# Patient Record
Sex: Female | Born: 2000
Health system: Southern US, Community
[De-identification: ages and names within clinical notes are randomized; demographics above are authoritative.]

## PROBLEM LIST (undated history)

## (undated) DIAGNOSIS — R569 Unspecified convulsions: Secondary | ICD-10-CM

## (undated) DIAGNOSIS — L02419 Cutaneous abscess of limb, unspecified: Secondary | ICD-10-CM

---
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
## 2018-01-01 NOTE — Telephone Encounter (Signed)
I have been out of the office since the afternoon of April 17th. I called patient today and left a message asking her to return the call. TG

## 2018-01-01 NOTE — Telephone Encounter (Signed)
Annette Poole called back and said that she had missed a quiz due to having a seizure. Her professor wants a note saying that she has epilepsy in order to excuse her and allow her to make up the quiz. She wants the note sent to [email or web address]. I wrote the letter and will email it to her as requested. TG

## 2018-10-15 ENCOUNTER — Other Ambulatory Visit (INDEPENDENT_AMBULATORY_CARE_PROVIDER_SITE_OTHER): Payer: Self-pay | Admitting: Family

## 2018-10-15 DIAGNOSIS — G40409 Other generalized epilepsy and epileptic syndromes, not intractable, without status epilepticus: Secondary | ICD-10-CM

## 2019-07-22 DIAGNOSIS — F418 Other specified anxiety disorders: Secondary | ICD-10-CM | POA: Insufficient documentation

## 2019-11-22 ENCOUNTER — Other Ambulatory Visit (INDEPENDENT_AMBULATORY_CARE_PROVIDER_SITE_OTHER): Payer: Self-pay | Admitting: Family

## 2019-11-22 DIAGNOSIS — G40409 Other generalized epilepsy and epileptic syndromes, not intractable, without status epilepticus: Secondary | ICD-10-CM

## 2019-11-24 NOTE — Telephone Encounter (Signed)
Patient has not been seen since 2019

## 2019-12-04 ENCOUNTER — Telehealth (INDEPENDENT_AMBULATORY_CARE_PROVIDER_SITE_OTHER): Payer: 59 | Admitting: Family

## 2019-12-08 ENCOUNTER — Other Ambulatory Visit (INDEPENDENT_AMBULATORY_CARE_PROVIDER_SITE_OTHER): Payer: Self-pay | Admitting: Family

## 2019-12-08 DIAGNOSIS — G40409 Other generalized epilepsy and epileptic syndromes, not intractable, without status epilepticus: Secondary | ICD-10-CM

## 2019-12-16 ENCOUNTER — Other Ambulatory Visit: Payer: Self-pay

## 2019-12-16 ENCOUNTER — Encounter (INDEPENDENT_AMBULATORY_CARE_PROVIDER_SITE_OTHER): Payer: Self-pay | Admitting: Family

## 2019-12-16 ENCOUNTER — Telehealth (INDEPENDENT_AMBULATORY_CARE_PROVIDER_SITE_OTHER): Payer: 59 | Admitting: Family

## 2019-12-16 DIAGNOSIS — G40409 Other generalized epilepsy and epileptic syndromes, not intractable, without status epilepticus: Secondary | ICD-10-CM | POA: Diagnosis not present

## 2019-12-16 DIAGNOSIS — G253 Myoclonus: Secondary | ICD-10-CM | POA: Diagnosis not present

## 2019-12-16 DIAGNOSIS — F418 Other specified anxiety disorders: Secondary | ICD-10-CM

## 2019-12-16 MED ORDER — LEVETIRACETAM 500 MG PO TABS: ORAL_TABLET | ORAL | 5 refills | Status: AC

## 2019-12-16 NOTE — Progress Notes (Signed)
This is a Pediatric Specialist E-Visit follow up consult provided via [place] consented to an E-Visit consult today.  Location of patient: Annette Poole is at home Location of provider: Molli Posey is in office Patient was referred by Delilah Shan, MD   The following participants were involved in this E-Visit: patient, CMA, provider  Chief Complain/ Reason for E-Visit today: Epilepsy Total time on call: 15 min Follow up: 1 year     Annette Poole   MRN:  [id number]  2001/03/30   Provider: Rockwell Germany NP-C Location of Care: [facility]  Visit type: Routine visit  Last visit: 10/01/2017  Referral source: Scarlette Ar, MD History from: patient and chcn chart  Brief history:  History of juvenile myoclonic epilepsy and obesity. She is taking and tolerating Levetiracetam for her seizure disorder. She has Clonazepam for myoclonic jerks but does not use it.  Today's concerns:  Annette Poole reports today that she has had no seizures since her last visit in 2019. She believes that she has had about 15 episodes of myoclonic jerks but says that they were brief and did not require intervention. She says that she has been complaint with taking Levetiracetam and usually gets 8 hours or more of sleep each night.   Annette Poole is in college and reports that she is doing well academically. She is interested in psychology as a major and is struggling with a statistics class at this time. She also reports that she started Lexapro about 2 months ago for depression and anxiety, and says that she feels much better. She plans to get established with a therapist but hasn't had time because of school.   Annette Poole has been otherwise generally healthy and has no other health concerns today other than previously mentioned.   Review of systems: Please see HPI for neurologic and other pertinent review of systems. Otherwise all other systems were reviewed and were  negative.  Problem List: Patient Active Problem List   Diagnosis Date Noted  . 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 Epilepsy, myoclonus ([place]) 12/28/2014  . Morbid obesity ([place]) 12/28/2014  . Acanthosis nigricans, acquired 12/28/2014     Past Medical History:  Diagnosis Date  . Abscess of axilla   . Seizures ([place]) 03/2015    Past medical history comments: See HPI Copied from previous record: Her brother also has juvenile myoclonic epilepsy characterized by myoclonus initially with generalized tonic-clonic seizures. He has an EEG that is compatible with the condition. Interestingly, he stopped taking antiepileptic medication on his own and has not had recurrent seizures. His last known seizure was in 2012. At the time of her referral to this office in April 2016, Annette Poole had two years of episodes of "tremors," which are actually of myoclonic jerks. If she is sitting writing and has a jerk, she loses control of the pen that she has in her hand. The episodes seemed more prominent in the afternoon than they do in the morning. There have been times when she has lost her balance when she was standing and fell either forward or backwards. This appears to be a familial disorder. Mother took Depakote in addition to her brother who was on Depakote. Annette Poole's EEG on December 25, 2014 revealed 10 episodes of generalized polyspike and slow wave activity that were somewhat irregularly contoured. The episodes were brief and did not coincide with any clinical behavior  Surgical history: Past Surgical History:  Procedure Laterality Date  . KIDNEY STONE SURGERY  12/06/11  Annette Poole    Family history: family history includes Congestive Heart Failure in her maternal grandfather and paternal grandfather; Seizures in her brother and mother.   Social history: Social History   Socioeconomic History  . Marital status: Legally Separated    Spouse name: Not on file  . Number of children:  Not on file  . Years of education: Not on file  . Highest education level: Not on file  Occupational History  . Not on file  Tobacco Use  . Smoking status: Never Smoker  . Smokeless tobacco: Never Used  Substance and Sexual Activity  . Alcohol use: No    Alcohol/week: 0.0 standard drinks  . Drug use: No  . Sexual activity: Never  Other Topics Concern  . 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   She graduated from [employer].   She is a Holiday representative at [employer].   She lives [place] campus.   She enjoys theater, music, and swimming.    Social Determinants of Health   Financial Resource Strain:   . Difficulty of Paying Living Expenses:   Food Insecurity:   . Worried About Programme researcher, broadcasting/film/video in the Last Year:   . Barista in the Last Year:   Transportation Needs:   . Freight forwarder (Medical):   Marland Kitchen Lack of Transportation (Non-Medical):   Physical Activity:   . Days of Exercise per Week:   . Minutes of Exercise per Session:   Stress:   . Feeling of Stress :   Social Connections:   . Frequency of Communication with Friends and Family:   . Frequency of Social Gatherings with Friends and Family:   . Attends Religious Services:   . Active Member of Clubs or Organizations:   . Attends Banker Meetings:   Marland Kitchen Marital Status:   Intimate Partner Violence:   . Fear of Current or Ex-Partner:   . Emotionally Abused:   Marland Kitchen Physically Abused:   . Sexually Abused:    Past/failed meds:  Allergies: Allergies  Allergen Reactions  . Banana      Immunizations:  There is no immunization history on file for this patient.   Diagnostics/Screenings: Copied from previous record: 12/25/2014 -This is a abnormal record with the patient awake, drowsy and asleep.  The presence of high-voltage polyspike and slow-wave activity is epileptogenic from an electrographic viewpoint and would correlate with the condition of juvenile  myoclonic epilepsy. Annette Carwin, MD    Physical Exam: There were no vitals taken for this visit.  Examination was limited due to technical quality of video visit General: Well developed, well nourished obese young woman, seated at her home, in no evident distress, black hair, brown eyes, right handed Head: Head normocephalic and atraumatic. Neck: Supple Musculoskeletal: No obvious deformities or scoliosis Skin: No rashes or neurocutaneous lesions  Neurologic Exam Mental Status: Awake and fully alert.  Oriented to place and time.  Recent and remote memory intact.  Attention span, concentration, and fund of knowledge appropriate.  Mood and affect appropriate. Cranial Nerves: Extraocular movements full without nystagmus. Hearing intact and symmetric to voice.  Facial sensation intact.  Face and tongue move normally and symmetrically.  Motor: Normal functional bulk, tone and strength Sensory: Intact to touch and temperature in all extremities.  Coordination: Finger-to-nose performed accurately bilaterally.  Gait and Station: Arises from chair without difficulty.  Stance is normal. Gait demonstrates normal  stride length and balance.   Impression: 1. Juvenile myoclonic epilepsy 2. Myoclonic jerks 3. Morbid obesity 4. Anxiety and depression  Recommendations for plan of care: The patient's previous [facility] records were reviewed. Annette Poole has neither had nor required imaging or lab studies since the last visit. She is a 19 year old woman with history of juvenile myoclonic epilepsy, myoclonic jerks, morbid obesity, anxiety and depression. She is taking and tolerating Levetiracetam and has remained seizure free since her last visit. She has occasional brief myoclonic jerks which are not problematic to her. She does not take Clonazepam when these jerks occur so I will discontinue that medication. Annette Poole is taking Lexapro (prescribed by another provider) and plans to get established with a  therapist. I reminded her of the need for her to be compliant with medication and to get at least 8 hours of sleep each night. I encouraged her to get established with a therapist for her mood disorder. I will see her back in follow up in 1 year or sooner if needed. Annette Poole agreed with the plans made today.   The medication list was reviewed and reconciled. No changes were made in the prescribed medications today. A complete medication list was provided to the patient.  Allergies as of 12/16/2019      Reactions   Banana       Medication List       Accurate as of December 16, 2019 11:59 PM. If you have any questions, ask your nurse or doctor.        STOP taking these medications   clonazePAM 0.25 MG disintegrating tablet Commonly known as: KLONOPIN Stopped by: Annette Rising, NP   meloxicam 15 MG tablet Commonly known as: MOBIC Stopped by: Annette Rising, NP   Tri-Previfem 0.18/0.215/0.25 MG-35 MCG tablet Generic drug: Norgestimate-Ethinyl Estradiol Triphasic Stopped by: Annette Rising, NP     TAKE these medications   escitalopram 20 MG tablet Commonly known as: LEXAPRO Take 20 mg by mouth daily.   etonogestrel 68 MG Impl implant Commonly known as: NEXPLANON 1 each by Subdermal route once.   levETIRAcetam 500 MG tablet Commonly known as: KEPPRA TAKE 1 AND 1/2 TABLETS TWICE PER DAY What changed: See the new instructions. Changed by: Annette Rising, NP       Total time spent on the video with the patient was 15 minutes, of which 50% or more was spent in counseling and coordination of care.  Annette Rising NP-C [facility] Ph. [phone] Fax [phone]

## 2019-12-20 ENCOUNTER — Encounter (INDEPENDENT_AMBULATORY_CARE_PROVIDER_SITE_OTHER): Payer: Self-pay | Admitting: Family

## 2019-12-20 NOTE — Patient Instructions (Signed)
Thank you for meeting with me by video today.   Instructions for you until your next appointment are as follows: 1. Continue taking the Levetiracetam as prescribed. Try not to miss any doses 2. Remember that it is important for you to get at least 8 hours of sleep each night, as sleep deprivation can trigger seizures 3. Work on getting established with a therapist to help with anxiety and depression as we discussed today.  4. Please sign up for MyChart if you have not done so 5. Please plan to return for follow up in one year or sooner if needed.

## 2020-07-09 ENCOUNTER — Other Ambulatory Visit (HOSPITAL_BASED_OUTPATIENT_CLINIC_OR_DEPARTMENT_OTHER): Payer: Self-pay | Admitting: Emergency Medicine

## 2020-07-09 ENCOUNTER — Encounter (HOSPITAL_BASED_OUTPATIENT_CLINIC_OR_DEPARTMENT_OTHER): Payer: Self-pay | Admitting: *Deleted

## 2020-07-09 ENCOUNTER — Emergency Department (HOSPITAL_BASED_OUTPATIENT_CLINIC_OR_DEPARTMENT_OTHER): Payer: BC Managed Care – PPO

## 2020-07-09 ENCOUNTER — Other Ambulatory Visit: Payer: Self-pay

## 2020-07-09 ENCOUNTER — Emergency Department (HOSPITAL_BASED_OUTPATIENT_CLINIC_OR_DEPARTMENT_OTHER)
Admission: EM | Admit: 2020-07-09 | Discharge: 2020-07-09 | Disposition: A | Discharge status: Home / Self Care | Payer: BC Managed Care – PPO | Attending: Emergency Medicine | Admitting: Emergency Medicine

## 2020-07-09 DIAGNOSIS — J90 Pleural effusion, not elsewhere classified: Secondary | ICD-10-CM | POA: Insufficient documentation

## 2020-07-09 DIAGNOSIS — R079 Chest pain, unspecified: Secondary | ICD-10-CM | POA: Diagnosis present

## 2020-07-09 DIAGNOSIS — Z79899 Other long term (current) drug therapy: Secondary | ICD-10-CM | POA: Diagnosis not present

## 2020-07-09 DIAGNOSIS — J189 Pneumonia, unspecified organism: Secondary | ICD-10-CM

## 2020-07-09 DIAGNOSIS — R0602 Shortness of breath: Secondary | ICD-10-CM | POA: Diagnosis not present

## 2020-07-09 DIAGNOSIS — Z20822 Contact with and (suspected) exposure to covid-19: Secondary | ICD-10-CM | POA: Insufficient documentation

## 2020-07-09 DIAGNOSIS — M25512 Pain in left shoulder: Secondary | ICD-10-CM | POA: Diagnosis not present

## 2020-07-09 LAB — RESPIRATORY PANEL BY RT PCR (FLU A&B, COVID)
Influenza A by PCR: NEGATIVE
Influenza B by PCR: NEGATIVE
SARS Coronavirus 2 by RT PCR: NEGATIVE

## 2020-07-09 LAB — PREGNANCY, URINE: Preg Test, Ur: NEGATIVE

## 2020-07-09 MED ORDER — AZITHROMYCIN 250 MG PO TABS: 250.0000 mg | ORAL_TABLET | Freq: Every day | ORAL | 0 refills | Status: DC

## 2020-07-09 MED ORDER — IOHEXOL 350 MG/ML SOLN
100.0000 mL | Freq: Once | INTRAVENOUS | Status: AC | PRN
  Administered 2020-07-09: 100 mL via INTRAVENOUS

## 2020-07-09 MED ORDER — NAPROXEN 500 MG PO TABS: 500.0000 mg | ORAL_TABLET | Freq: Two times a day (BID) | ORAL | 0 refills | Status: DC

## 2020-07-09 MED ORDER — AMOXICILLIN 500 MG PO CAPS
1000.0000 mg | ORAL_CAPSULE | Freq: Once | ORAL | Status: AC
  Administered 2020-07-09: 1000 mg via ORAL
  Filled 2020-07-09: qty 2

## 2020-07-09 MED ORDER — AMOXICILLIN 500 MG PO CAPS: 1000.0000 mg | ORAL_CAPSULE | Freq: Three times a day (TID) | ORAL | 0 refills | Status: DC

## 2020-07-09 MED ORDER — KETOROLAC TROMETHAMINE 15 MG/ML IJ SOLN
15.0000 mg | Freq: Once | INTRAMUSCULAR | Status: AC
  Administered 2020-07-09: 15 mg via INTRAVENOUS
  Filled 2020-07-09: qty 1

## 2020-07-09 MED ORDER — AZITHROMYCIN 250 MG PO TABS
500.0000 mg | ORAL_TABLET | Freq: Once | ORAL | Status: AC
  Administered 2020-07-09: 500 mg via ORAL
  Filled 2020-07-09: qty 2

## 2020-07-09 MED FILL — AMOXICILLIN 500 MG CAPSULE: 500 | 7 days supply | Qty: 42 | Fill #0

## 2020-07-09 MED FILL — NAPROXEN 500 MG TABS: 500 | 15 days supply | Qty: 30 | Fill #0

## 2020-07-09 MED FILL — AZITHROMYCIN 250 MG TABS: 250 | 4 days supply | Qty: 4 | Fill #0

## 2020-07-09 NOTE — ED Triage Notes (Signed)
She slept wrong 3 days ago and woke with pain in her neck and back that felt like a "crick". This am the pain is worse.

## 2020-07-09 NOTE — ED Provider Notes (Signed)
She is [facility] EMERGENCY DEPARTMENT Provider Note   CSN: [id number] Arrival date & time: 07/09/20  1251     History Chief Complaint  Patient presents with  . Neck Pain    Annette Poole is a 19 y.o. female.  Patient presents the emergency department for evaluation of chest and shoulder pain.  Patient has had 3 days of worsening pain in her left shoulder, upper arm, and underneath her left breast.  Pain is worse with taking a deep breathing.  It is unchanged with movement of her upper extremity.  Patient went to [facility] and had lab testing performed as well as a chest x-ray.  Patient had a D-dimer that was elevated at 0.76.  She was referred here for further evaluation.  Remainder of lab work and chest x-ray were unremarkable including white blood cell count 11,000 with normal neutrophils, normal troponin, normal kidney function.  No fever, nausea or vomiting, diarrhea. Patient denies risk factors for pulmonary embolism including: unilateral leg swelling, history of DVT/PE/other blood clots, use of exogenous estrogen (no OCP -- recent nexplanon), recent immobilizations, recent surgery, recent travel (>4hr segment), malignancy, hemoptysis.          Past Medical History:  Diagnosis Date  . Abscess of axilla   . Seizures ([facility]) 03/2015    Patient Active Problem List   Diagnosis Date Noted  . Depression with anxiety 07/22/2019  . 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 Morbid obesity ([facility]) 12/28/2014  . 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 KIDNEY STONE SURGERY  12/06/11   [facility]     OB History   No obstetric history on file.     Family History  Problem Relation Age of Onset  . Seizures Mother   . Congestive Heart Failure Maternal Grandfather        Died at 42  . Congestive Heart Failure Paternal Grandfather        Died at 58  . Seizures  Brother     Social History   Tobacco Use  . Smoking status: Never Smoker  . Smokeless tobacco: Never Used  Substance Use Topics  . Alcohol use: No    Alcohol/week: 0.0 standard drinks  . Drug use: No    Home Medications Prior to Admission medications   Medication Sig Start Date End Date Taking? Authorizing Provider  escitalopram (LEXAPRO) 20 MG tablet Take 20 mg by mouth daily. 09/07/19  Yes [provider]  etonogestrel (NEXPLANON) 68 MG IMPL implant 1 each by Subdermal route once.   Yes [provider]  levETIRAcetam (KEPPRA) 500 MG tablet TAKE 1 AND 1/2 TABLETS TWICE PER DAY 12/16/19  Yes Elveria Rising, NP    Allergies    Banana  Review of Systems   Review of Systems  Constitutional: Negative for diaphoresis and fever.  Eyes: Negative for redness.  Respiratory: Positive for shortness of breath (shallow breathing). Negative for cough.   Cardiovascular: Positive for chest pain. Negative for palpitations and leg swelling.  Gastrointestinal: Negative for abdominal pain, nausea and vomiting.  Genitourinary: Negative for dysuria.  Musculoskeletal: Positive for myalgias. Negative for back pain and neck pain.  Skin: Negative for rash.  Neurological: Negative for syncope and light-headedness.  Psychiatric/Behavioral: The patient is not nervous/anxious.     Physical Exam Updated Vital Signs BP 134/88   Pulse 88   Temp 98.1 F (36.7 C) (Oral)  Resp 14   Ht 5\' 1"  (1.549 m)   Wt 104.8 kg   LMP 06/11/2020   SpO2 99%   BMI 43.65 kg/m   Physical Exam Vitals and nursing note reviewed.  Constitutional:      General: She is not in acute distress.    Appearance: She is well-developed.  HENT:     Head: Normocephalic and atraumatic.     Right Ear: External ear normal.     Left Ear: External ear normal.     Nose: Nose normal.  Eyes:     Conjunctiva/sclera: Conjunctivae normal.  Cardiovascular:     Rate and Rhythm: Normal rate and regular rhythm.      Heart sounds: No murmur heard.   Pulmonary:     Effort: No respiratory distress.     Breath sounds: No wheezing, rhonchi or rales.  Abdominal:     Palpations: Abdomen is soft.     Tenderness: There is no abdominal tenderness. There is no guarding or rebound.  Musculoskeletal:     Cervical back: Normal range of motion and neck supple.     Right lower leg: No edema.     Left lower leg: No edema.     Comments: Patient with no significant tenderness to palpation of the left shoulder.  She is able to raise her arm and lower her arm without difficulty.  Skin:    General: Skin is warm and dry.     Findings: No rash.  Neurological:     General: No focal deficit present.     Mental Status: She is alert. Mental status is at baseline.     Motor: No weakness.  Psychiatric:        Mood and Affect: Mood normal.     ED Results / Procedures / Treatments   Labs (all labs ordered are listed, but only abnormal results are displayed) Labs Reviewed  RESPIRATORY PANEL BY RT PCR (FLU A&B, COVID)  PREGNANCY, URINE    ED ECG REPORT   Date: 07/09/2020  Rate: 77  Rhythm: normal sinus rhythm  QRS Axis: normal  Intervals: PR shortened  ST/T Wave abnormalities: normal  Conduction Disutrbances:none  Narrative Interpretation:   Old EKG Reviewed: none available  I have personally reviewed the EKG tracing and agree with the computerized printout as noted.  Radiology CT Angio Chest PE W and/or Wo Contrast  Result Date: 07/09/2020 CLINICAL DATA:  Shortness of breath with elevated D-dimer EXAM: CT ANGIOGRAPHY CHEST WITH CONTRAST TECHNIQUE: Multidetector CT imaging of the chest was performed using the standard protocol during bolus administration of intravenous contrast. Multiplanar CT image reconstructions and MIPs were obtained to evaluate the vascular anatomy. CONTRAST:  07/11/2020 OMNIPAQUE IOHEXOL 350 MG/ML SOLN COMPARISON:  Chest radiograph July 09, 2020 FINDINGS: Cardiovascular: There is no  demonstrable pulmonary embolus. There is no appreciable thoracic aortic aneurysm or dissection. Visualized great vessels appear unremarkable. No pericardial effusion or pericardial thickening evident. Mediastinum/Nodes: Visualized thyroid appears normal. Thymic tissue is normal for age. No adenopathy is evident. No esophageal lesions are appreciable. Lungs/Pleura: There is a small right pleural effusion with patchy airspace opacity in the inferior lingula as well as in portions of the anterior and posterior segments of the left lower lobe. The lungs elsewhere are clear. Upper Abdomen: Visualized upper abdominal structures appear unremarkable. Musculoskeletal: There are no blastic or lytic bone lesions. No evident chest wall lesions. Review of the MIP images confirms the above findings. IMPRESSION: 1. No demonstrable pulmonary embolus. No thoracic  aortic aneurysm or dissection. 2. Areas of patchy infiltrate in the inferior lingula and left lower lobe with small left pleural effusion. Lungs elsewhere clear. 3.  No evident adenopathy. Electronically Signed   By: Bretta Bang III M.D.   On: 07/09/2020 14:33    Procedures Procedures (including critical care time)  Medications Ordered in ED Medications  iohexol (OMNIPAQUE) 350 MG/ML injection 100 mL (100 mLs Intravenous Contrast Given 07/09/20 1402)  azithromycin (ZITHROMAX) tablet 500 mg (500 mg Oral Given 07/09/20 1454)  amoxicillin (AMOXIL) capsule 1,000 mg (1,000 mg Oral Given 07/09/20 1455)  ketorolac (TORADOL) 15 MG/ML injection 15 mg (15 mg Intravenous Given 07/09/20 1506)    ED Course  I have reviewed the triage vital signs and the nursing notes.  Pertinent labs & imaging results that were available during my care of the patient were reviewed by me and considered in my medical decision making (see chart for details).  Patient seen and examined. Work-up initiated. Reviewed labs in CareEverywhere. CT ordered.  Patient declines pain medication  at this time.  Vital signs reviewed and are as follows: BP 134/88   Pulse 88   Temp 98.1 F (36.7 C) (Oral)   Resp 14   Ht 5\' 1"  (1.549 m)   Wt 104.8 kg   LMP 06/11/2020   SpO2 99%   BMI 43.65 kg/m   3:45 PM CT results reviewed with patient and mother at bedside.  We discussed no blood clots, possible pneumonia and small pleural effusion.  Discussed need for follow-up.  Patient will be started on azithromycin and amoxicillin.  Return to emergency department with uncontrolled pain, vomiting, high persistent fever, new symptoms, worsening shortness of breath or trouble breathing, or other concerns.  They verbalize understanding agree with plan.  EKG reviewed.     MDM Rules/Calculators/A&P                          Patient presents with chest pain likely related to pleurisy.  Patient had an elevated D-dimer at PCP office, however negative chest CT here.  Patient does have patchy infiltrates in the left lingula and lower lobe.  Patient be treated for community-acquired pneumonia and encouraged to follow-up with her primary care doctor to ensure improvement in symptoms.  Vital signs are reassuring.   Final Clinical Impression(s) / ED Diagnoses Final diagnoses:  Pleurisy with effusion  Community acquired pneumonia of left lung, unspecified part of lung    Rx / DC Orders ED Discharge Orders         Ordered    azithromycin (ZITHROMAX) 250 MG tablet  Daily        07/09/20 1542    amoxicillin (AMOXIL) 500 MG capsule  3 times daily        07/09/20 1542    naproxen (NAPROSYN) 500 MG tablet  2 times daily        07/09/20 1542           07/11/20, PA-C 07/09/20 1546    07/11/20, MD 07/09/20 1551

## 2020-07-09 NOTE — Discharge Instructions (Signed)
Please read and follow all provided instructions.  Your diagnoses today include:  1. Pleurisy with effusion   2. Community acquired pneumonia of left lung, unspecified part of lung     Tests performed today include: COVID test - pending CT scan of your chest -does not show any blood clots today, however it does show potential pneumonia in the left lung with a mild fluid collection. Pregnancy test-was negative  Vital signs. See below for your results today.   Medications prescribed:   Amoxicillin - antibiotic  You have been prescribed an antibiotic medicine: take the entire course of medicine even if you are feeling better. Stopping early can cause the antibiotic not to work.   Azithromycin - antibiotic for respiratory infection  You have been prescribed an antibiotic medicine: take the entire course of medicine even if you are feeling better. Stopping early can cause the antibiotic not to work.   Naproxen - anti-inflammatory pain medication  Do not exceed 500mg  naproxen every 12 hours, take with food  You have been prescribed an anti-inflammatory medication or NSAID. Take with food. Take smallest effective dose for the shortest duration needed for your pain. Stop taking if you experience stomach pain or vomiting.   Take any prescribed medications only as directed.  Home care instructions:  Follow any educational materials contained in this packet.  Take the complete course of antibiotics that you were prescribed.   BE VERY CAREFUL not to take multiple medicines containing Tylenol (also called acetaminophen). Doing so can lead to an overdose which can damage your liver and cause liver failure and possibly death.   Follow-up instructions: Please follow-up with your primary care provider in the next 3 days for further evaluation of your symptoms and to ensure resolution of your infection.   Return instructions:   Please return to the Emergency Department if you experience  worsening symptoms.   Return immediately with worsening breathing, worsening shortness of breath, or if you feel it is taking you more effort to breathe.   Please return if you have any other emergent concerns.  Additional Information:  Your vital signs today were: BP 123/88   Pulse 82   Temp 98.1 F (36.7 C) (Oral)   Resp 14   Ht 5\' 1"  (1.549 m)   Wt 104.8 kg   LMP 06/11/2020   SpO2 100%   BMI 43.65 kg/m  If your blood pressure (BP) was elevated above 135/85 this visit, please have this repeated by your doctor within one month. --------------

## 2021-01-13 ENCOUNTER — Encounter (INDEPENDENT_AMBULATORY_CARE_PROVIDER_SITE_OTHER): Payer: Self-pay

## 2021-03-14 IMAGING — CT CT ANGIO CHEST
2 of 20 series · 13 of 38 positions shown · IV contrast (Omnipaque)
Comparison: Chest radiograph July 09, 2020

CLINICAL DATA: Shortness of breath with elevated D-dimer

EXAM:
CT ANGIOGRAPHY CHEST WITH CONTRAST
TECHNIQUE: Multidetector CT imaging of the chest was performed using the
standard protocol during bolus administration of intravenous
contrast. Multiplanar CT image reconstructions and MIPs were
obtained to evaluate the vascular anatomy.
CONTRAST:  100mL OMNIPAQUE IOHEXOL 350 MG/ML SOLN

[Series 11: pe thins · axial · 0.74mm/px · z∈[+894,+1046]mm · 6 of 214 slices shown (1 of 2)]
[im 31/214  lung]
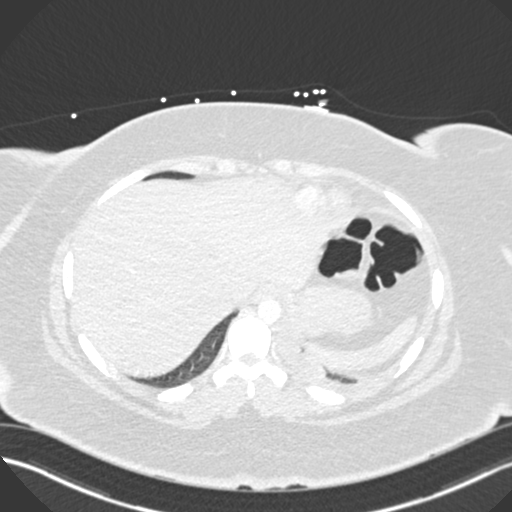
[im 61/214  lung]
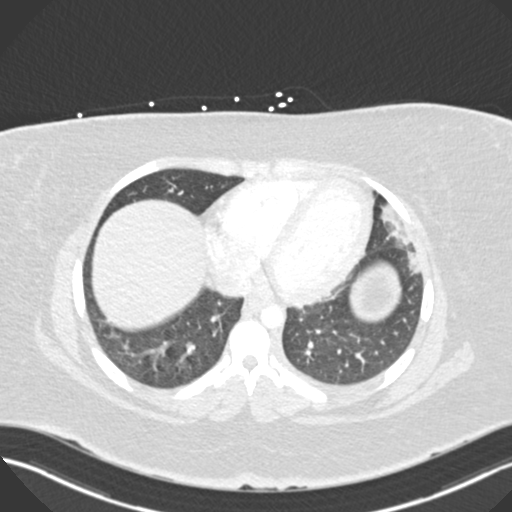
[im 92/214  lung]
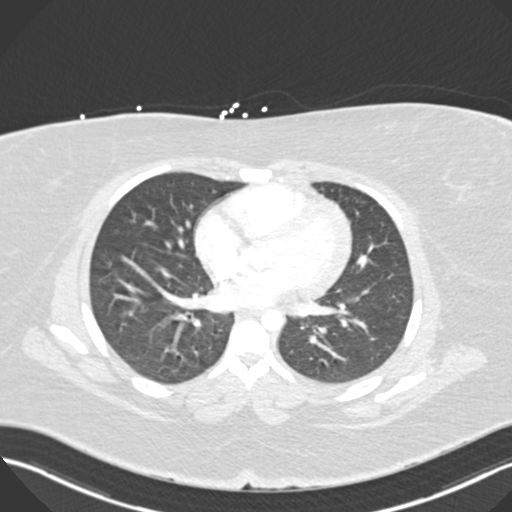
[im 122/214  lung]
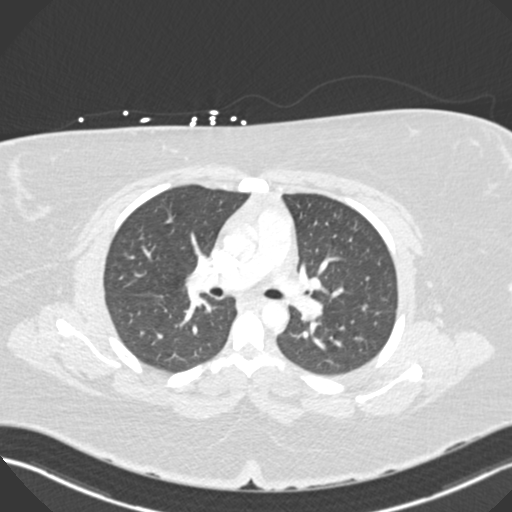
[im 153/214  lung]
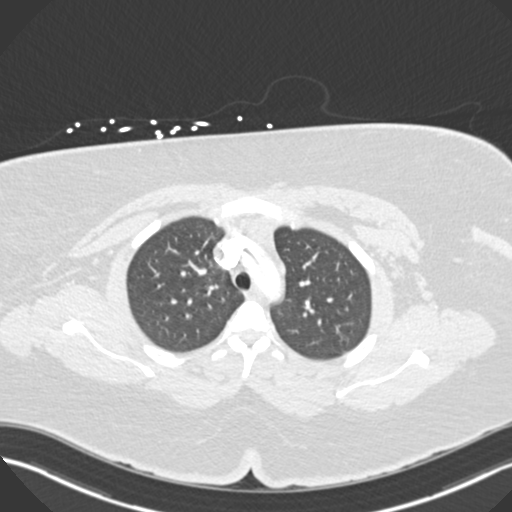
[im 183/214  lung]
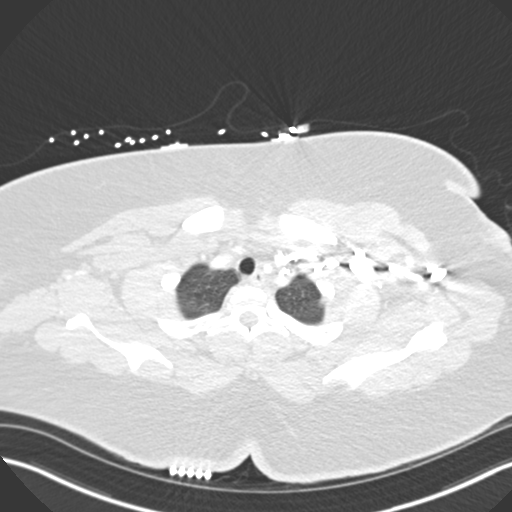

[Series 16: pe thins · axial · 0.70mm/px · z∈[+858,+1042]mm · 7 of 246 slices shown (2 of 2)]
[im 31/246  lung]
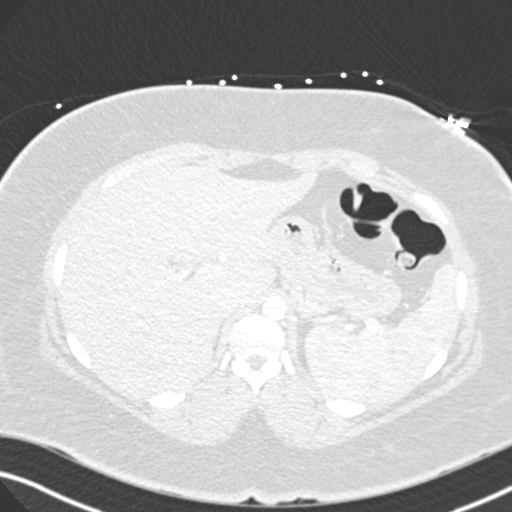
[im 62/246  mediastinal]
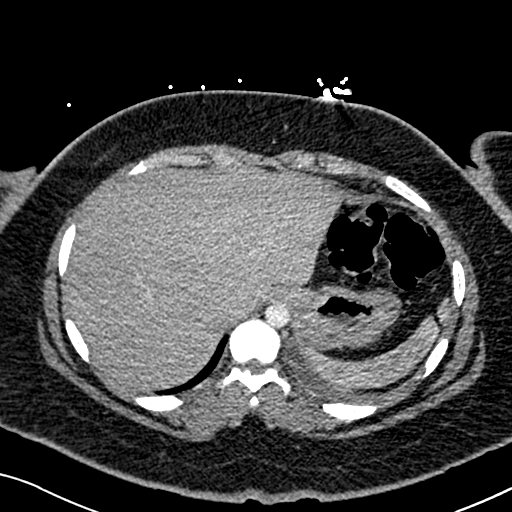
[im 92/246  lung]
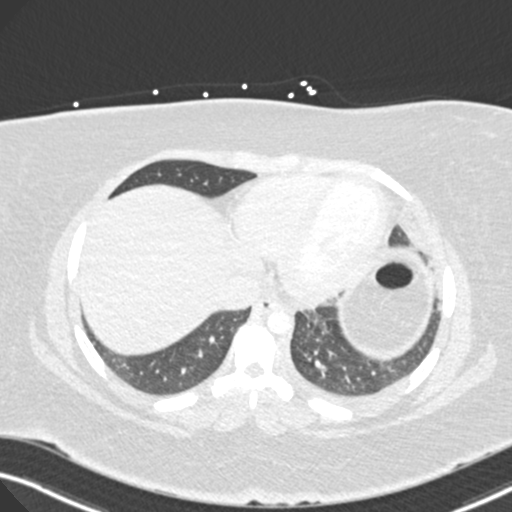
[im 123/246  mediastinal]
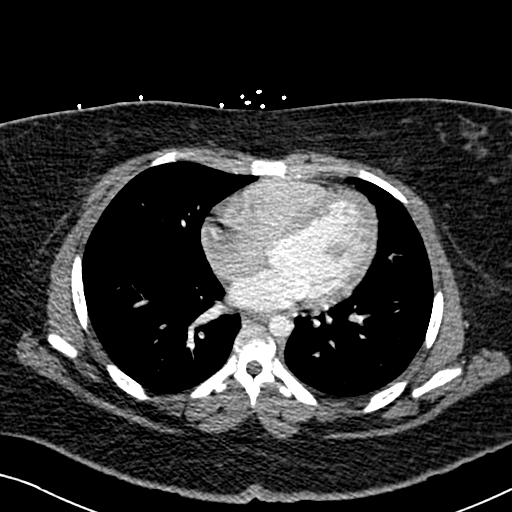
[im 154/246  lung]
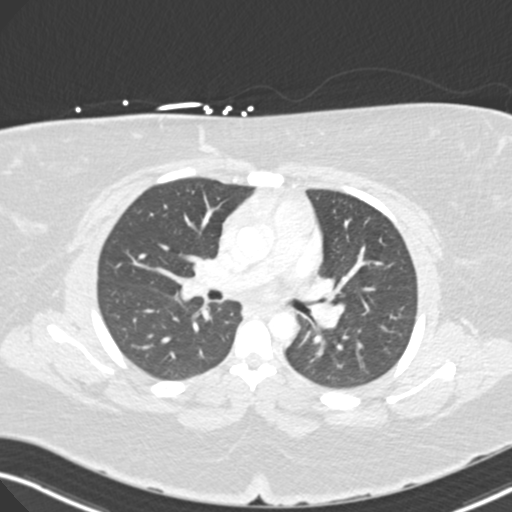
[im 184/246  mediastinal]
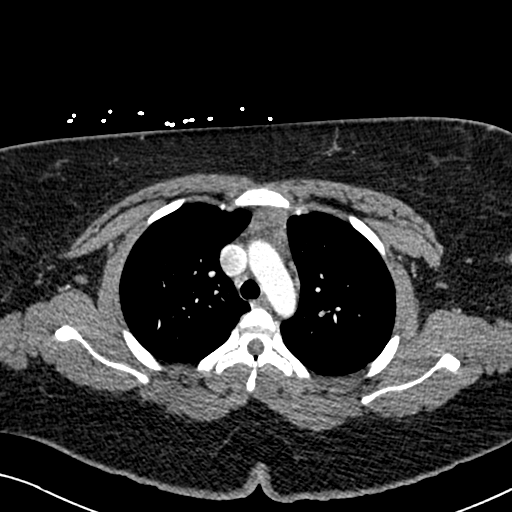
[im 215/246  lung]
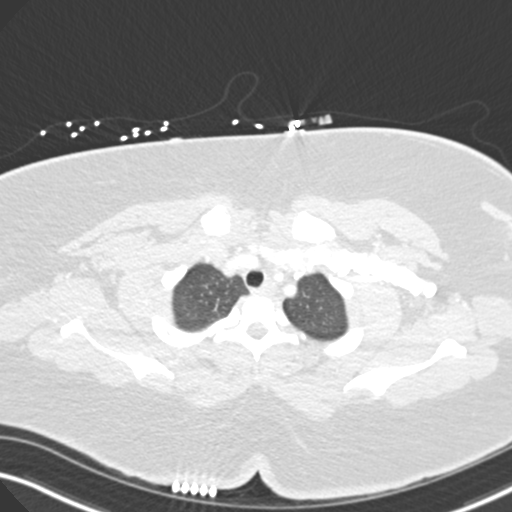

[13 of 38 positions shown; findings below may reference images not displayed]

FINDINGS: Cardiovascular: There is no demonstrable pulmonary embolus. There is
no appreciable thoracic aortic aneurysm or dissection. Visualized
great vessels appear unremarkable. No pericardial effusion or
pericardial thickening evident.

Mediastinum/Nodes: Visualized thyroid appears normal. Thymic tissue
is normal for age. No adenopathy is evident. No esophageal lesions
are appreciable.

Lungs/Pleura: There is a small right pleural effusion with patchy
airspace opacity in the inferior lingula as well as in portions of
the anterior and posterior segments of the left lower lobe. The
lungs elsewhere are clear.

Upper Abdomen: Visualized upper abdominal structures appear
unremarkable.

Musculoskeletal: There are no blastic or lytic bone lesions. No
evident chest wall lesions.

Review of the MIP images confirms the above findings.
IMPRESSION: 1. No demonstrable pulmonary embolus. No thoracic aortic aneurysm or
dissection.

2. Areas of patchy infiltrate in the inferior lingula and left lower
lobe with small left pleural effusion. Lungs elsewhere clear.

3.  No evident adenopathy.

## 2023-07-27 ENCOUNTER — Emergency Department (HOSPITAL_BASED_OUTPATIENT_CLINIC_OR_DEPARTMENT_OTHER)
Admission: EM | Admit: 2023-07-27 | Discharge: 2023-07-27 | Disposition: A | Discharge status: Home / Self Care | Payer: Commercial Managed Care - PPO | Attending: Emergency Medicine | Admitting: Emergency Medicine

## 2023-07-27 ENCOUNTER — Emergency Department (HOSPITAL_BASED_OUTPATIENT_CLINIC_OR_DEPARTMENT_OTHER): Payer: Commercial Managed Care - PPO

## 2023-07-27 ENCOUNTER — Other Ambulatory Visit: Payer: Self-pay

## 2023-07-27 ENCOUNTER — Encounter (HOSPITAL_BASED_OUTPATIENT_CLINIC_OR_DEPARTMENT_OTHER): Payer: Self-pay

## 2023-07-27 DIAGNOSIS — R2231 Localized swelling, mass and lump, right upper limb: Secondary | ICD-10-CM | POA: Insufficient documentation

## 2023-07-27 DIAGNOSIS — M7989 Other specified soft tissue disorders: Secondary | ICD-10-CM

## 2023-07-27 LAB — CBC WITH DIFFERENTIAL/PLATELET
Abs Immature Granulocytes: 0 10*3/uL (ref 0.00–0.07)
Basophils Absolute: 0 10*3/uL (ref 0.0–0.1)
Basophils Relative: 1 %
Eosinophils Absolute: 0.2 10*3/uL (ref 0.0–0.5)
Eosinophils Relative: 3 %
HCT: 37.4 % (ref 36.0–46.0)
Hemoglobin: 13.3 g/dL (ref 12.0–15.0)
Immature Granulocytes: 0 %
Lymphocytes Relative: 64 %
Lymphs Abs: 4.4 10*3/uL — ABNORMAL HIGH (ref 0.7–4.0)
MCH: 29.1 pg (ref 26.0–34.0)
MCHC: 35.6 g/dL (ref 30.0–36.0)
MCV: 81.8 fL (ref 80.0–100.0)
Monocytes Absolute: 0.6 10*3/uL (ref 0.1–1.0)
Monocytes Relative: 9 %
Neutro Abs: 1.5 10*3/uL — ABNORMAL LOW (ref 1.7–7.7)
Neutrophils Relative %: 23 %
Platelets: 367 10*3/uL (ref 150–400)
RBC: 4.57 MIL/uL (ref 3.87–5.11)
RDW: 14.6 % (ref 11.5–15.5)
WBC: 6.7 10*3/uL (ref 4.0–10.5)
nRBC: 0 % (ref 0.0–0.2)

## 2023-07-27 LAB — COMPREHENSIVE METABOLIC PANEL
ALT: 11 U/L (ref 0–44)
AST: 26 U/L (ref 15–41)
Albumin: 3.6 g/dL (ref 3.5–5.0)
Alkaline Phosphatase: 53 U/L (ref 38–126)
Anion gap: 10 (ref 5–15)
BUN: <5 mg/dL — ABNORMAL LOW (ref 6–20)
CO2: 23 mmol/L (ref 22–32)
Calcium: 8.9 mg/dL (ref 8.9–10.3)
Chloride: 101 mmol/L (ref 98–111)
Creatinine, Ser: 0.64 mg/dL (ref 0.44–1.00)
GFR, Estimated: >60 mL/min (ref >60)
Glucose, Bld: 106 mg/dL — ABNORMAL HIGH (ref 70–99)
Potassium: 4.4 mmol/L (ref 3.5–5.1)
Sodium: 134 mmol/L — ABNORMAL LOW (ref 135–145)
Total Bilirubin: 1.2 mg/dL — ABNORMAL HIGH (ref <1.2)
Total Protein: 7.4 g/dL (ref 6.5–8.1)

## 2023-07-27 LAB — HCG, QUANTITATIVE, PREGNANCY: hCG, Beta Chain, Quant, S: <1 m[IU]/mL (ref <5)

## 2023-07-27 MED ORDER — OXYCODONE-ACETAMINOPHEN 5-325 MG PO TABS
1.0000 | ORAL_TABLET | Freq: Once | ORAL | Status: AC
  Administered 2023-07-27: 1 via ORAL
  Filled 2023-07-27: qty 1

## 2023-07-27 MED ORDER — IOHEXOL 350 MG/ML SOLN
100.0000 mL | Freq: Once | INTRAVENOUS | Status: AC | PRN
  Administered 2023-07-27: 100 mL via INTRAVENOUS

## 2023-07-27 MED ORDER — LORAZEPAM 1 MG PO TABS
1.0000 mg | ORAL_TABLET | Freq: Once | ORAL | Status: AC
  Administered 2023-07-27: 1 mg via ORAL
  Filled 2023-07-27: qty 1

## 2023-07-27 NOTE — ED Triage Notes (Addendum)
Pt presents with swelling to right hand and fingers that she noticed tonight at 2130 (07/26/23). She had arm flap surgery to right axilla on 07/23/23 for HS.  JP drain present with minimal serous drainage noted.   Hand feels cold to the touch (both hands are cold to touch and feel the same), cap refill right at 3-4 seconds, sensation intact, + radial pulse. She states she has been compliant with icing and elevating her arm.

## 2023-07-27 NOTE — Discharge Instructions (Addendum)
Please follow-up with surgery clinic by calling them today to discuss your ongoing swelling and pain in the right arm.  If you develop a loss of feeling in your hand, discoloration of your hand (turning white, purple, blue) or fingers, return to the ER immediately.

## 2023-07-27 NOTE — ED Notes (Signed)
Patient transported to CT 

## 2023-07-27 NOTE — ED Notes (Signed)
Reviewed discharge instructions and follow up with pt. Pt states understanding. Ambulatory at discharge with family

## 2023-07-27 NOTE — ED Provider Notes (Signed)
22 yo female presenting to ED with right forearm and hand swelling, hidradenitis excision at [place] on 07/23/23  EDP provider felt dressing too tight on arrival, loosened  [facility] oncall provider spoke with ED provider  Plan for dvt ultrasound here and likely follow up with [facility] - they will contact patient for appointment  Physical Exam  BP 129/73 (BP Location: Left Arm)   Pulse 81   Temp 98.1 F (36.7 C) (Oral)   Resp 16   Ht 5\' 1"  (1.549 m)   Wt 65.3 kg   LMP 07/13/2023 (Approximate)   SpO2 100%   BMI 27.21 kg/m   Physical Exam  Procedures  Procedures  ED Course / MDM    Medical Decision Making Amount and/or Complexity of Data Reviewed Labs: ordered. Radiology: ordered.  Risk Prescription drug management.   Patient reassessed in the morning.  Overall she feels that the pain has improved although she continues to have some swelling of the forearm and the hand and the wrist.  She has brisk cap refills in her fingers, does not appear delayed.  I wonder whether her dressing had been applied too tightly initially.    Ultrasound reviewed showing no evident thrombosis, although some of the exam is limited by the patient's discomfort.  Patient was not wanting any pain medicine.  Her vitals have been stable overnight.  She has been observed for nearly 9 hours in the ED with no evidence of compromise blood flow at this point to the hand.  I think she is stable for discharge.  Her father is here to help her get home safely.  She has follow-up arranged with her surgeon whose office will be reaching out to her today, per overnight EDP's discussion with [facility] oncall provider last night.  No doppler ultrasound available in [facility] ED at this time, but her pulse and cap refill appear normal - okay to forego this imaging at this time.    Terald Sleeper, MD 07/27/23 [phone]

## 2023-07-27 NOTE — ED Provider Notes (Addendum)
[facility] Provider Note   CSN: [id number] Arrival date & time: 07/27/23  0149     History  Chief Complaint  Patient presents with   Post-op Problem    Annette Poole is a 22 y.o. female.  Patient's status post right axillary flap excision by plastic surgery at [facility] by Dr. Senaida Ores November 11.  She has a JP drain in place.  She noticed swelling to her right hand and fingers about 930 tonight that came on suddenly.  Right hand and arm are swollen, cool to touch, delayed capillary refill.  States the pain in the axilla is the same.  No fever.  No chest pain or shortness of breath.  Believes her incisions are healing okay.  She is on antibiotics and pain medication at home. Minimal output from her JP drain has been stable.  She has been compliant with icing and elevating her arm.  She had sudden onset coolness, swelling, decreased capillary refill to her right arm.  No history of blood clots.  Does have Nexplanon birth control.  The history is provided by the patient and a relative.       Home Medications Prior to Admission medications   Medication Sig Start Date End Date Taking? Authorizing Provider  escitalopram (LEXAPRO) 20 MG tablet Take 20 mg by mouth daily. 09/07/19   [provider]  etonogestrel (NEXPLANON) 68 MG IMPL implant 1 each by Subdermal route once.    [provider]  levETIRAcetam (KEPPRA) 500 MG tablet TAKE 1 AND 1/2 TABLETS TWICE PER DAY Patient taking differently: Take 750 mg by mouth 2 (two) times daily.  12/16/19   Elveria Rising, NP      Allergies    Banana    Review of Systems   Review of Systems  Constitutional:  Negative for activity change, appetite change and fever.  HENT:  Negative for congestion and rhinorrhea.   Respiratory:  Negative for cough, chest tightness and shortness of breath.   Cardiovascular:  Negative for chest pain.  Gastrointestinal:  Negative for  abdominal pain, nausea and vomiting.  Genitourinary:  Negative for dysuria and hematuria.  Musculoskeletal:  Positive for arthralgias and back pain.  Skin:  Negative for rash.  Neurological:  Positive for numbness. Negative for dizziness, weakness and headaches.   all other systems are negative except as noted in the HPI and PMH.    Physical Exam Updated Vital Signs BP (!) 136/108 (BP Location: Left Arm)   Pulse 89   Temp 98.6 F (37 C) (Oral)   Resp 17   Ht 5\' 1"  (1.549 m)   Wt 65.3 kg   LMP 07/13/2023 (Approximate)   SpO2 100%   BMI 27.21 kg/m  Physical Exam Vitals and nursing note reviewed.  Constitutional:      General: She is not in acute distress.    Appearance: She is well-developed. She is not ill-appearing.  HENT:     Head: Normocephalic and atraumatic.     Mouth/Throat:     Pharynx: No oropharyngeal exudate.  Eyes:     Conjunctiva/sclera: Conjunctivae normal.     Pupils: Pupils are equal, round, and reactive to light.  Neck:     Comments: No meningismus. Cardiovascular:     Rate and Rhythm: Normal rate and regular rhythm.     Heart sounds: Normal heart sounds. No murmur heard. Pulmonary:     Effort: Pulmonary effort is normal. No respiratory distress.  Breath sounds: Normal breath sounds.  Chest:     Chest wall: No tenderness.  Abdominal:     Palpations: Abdomen is soft.     Tenderness: There is no abdominal tenderness. There is no guarding or rebound.  Musculoskeletal:        General: Swelling, tenderness and signs of injury present.     Cervical back: Normal range of motion and neck supple.     Comments: Right arm is in a sling.  There is swelling of the right hand diffusely that extends to mid forearm.  Capillary refill is slowed and arm is cool to the touch.  Radial Pulse difficult to palpate but is present with Doppler.  Radial and ulnar pulse present with Doppler.  Reduced range of motion of fingers secondary to pain.  Axillary incisions with  Steri-Strips appear to be healing well.  Minimal serosanguineous output in JP drain  Compartments are soft  Skin:    General: Skin is warm.  Neurological:     Mental Status: She is alert and oriented to person, place, and time.     Cranial Nerves: No cranial nerve deficit.     Motor: No abnormal muscle tone.     Coordination: Coordination normal.     Comments:  5/5 strength throughout. CN 2-12 intact.Equal grip strength.   Psychiatric:        Behavior: Behavior normal.     ED Results / Procedures / Treatments   Labs (all labs ordered are listed, but only abnormal results are displayed) Labs Reviewed  CBC WITH DIFFERENTIAL/PLATELET - Abnormal; Notable for the following components:      Result Value   Neutro Abs 1.5 (*)    Lymphs Abs 4.4 (*)    All other components within normal limits  COMPREHENSIVE METABOLIC PANEL - Abnormal; Notable for the following components:   Sodium 134 (*)    Glucose, Bld 106 (*)    BUN <5 (*)    Total Bilirubin 1.2 (*)    All other components within normal limits  HCG, QUANTITATIVE, PREGNANCY    EKG None  Radiology CT ANGIO UP EXTREM RIGHT W &/OR WO CONTRAST  Result Date: 07/27/2023 CLINICAL DATA:  Recent right axillary surgery for abscess and hidradenitis. EXAM: CT ANGIOGRAPHY UPPER RIGHT EXTREMITY TECHNIQUE: After bolus administration of iodinated contrast arterially timed CT of the right upper extremity was performed. CONTRAST:  OMNIPAQUE IOHEXOL 350 MG/ML SOLN COMPARISON:  None Available. FINDINGS: There is limitation of detail due to motion artifact and arms down positioning with attenuation. Arteries of the arm and forearm are smoothly contoured and widely patent when allowing for this limitation. Quality/timing is insufficient for evaluation of hand and digital arteries which are diffusely unseen. A second phase was taken through the upper chest and axilla which is also unremarkable at the level of the subclavian and axillary arteries and  their branches. This is the only phase with any right-sided venous opacification, and early at that. No thrombotic disease is seen within the veins. There is drain and expected postoperative gas and swelling at the right axilla. Review of the MIP images confirms the above findings. IMPRESSION: Normal arterial structures in the arm and forearm. Study is insufficient for evaluating arteries of the hand and digits. Electronically Signed   By: Tiburcio Pea M.D.   On: 07/27/2023 06:31   CT Angio Chest PE W and/or Wo Contrast  Result Date: 07/27/2023 CLINICAL DATA:  Pulmonary embolism suspected. Arm flap surgery to right axilla 4  days ago for hidradenitis. Swelling in the right hand and fingers. EXAM: CT ANGIOGRAPHY CHEST WITH CONTRAST TECHNIQUE: Multidetector CT imaging of the chest was performed using the standard protocol during bolus administration of intravenous contrast. Multiplanar CT image reconstructions and MIPs were obtained to evaluate the vascular anatomy. RADIATION DOSE REDUCTION: This exam was performed according to the departmental dose-optimization program which includes automated exposure control, adjustment of the mA and/or kV according to patient size and/or use of iterative reconstruction technique. CONTRAST:  OMNIPAQUE IOHEXOL 350 MG/ML SOLN COMPARISON:  07/09/2020 FINDINGS: Cardiovascular: Satisfactory opacification of the pulmonary arteries to the segmental level. No evidence of pulmonary embolism. Normal heart size. No pericardial effusion. Normal appearance of the great vessels and covered right upper extremity arteries. Mediastinum/Nodes: No hematoma or adenopathy Lungs/Pleura: A few pulmonary nodules measuring less than a cm in the left lung, loosely clustered in the left lower lobe. No segmental consolidation, edema, effusion, or pneumothorax. Upper Abdomen: No acute finding Musculoskeletal: No acute finding Review of the MIP images confirms the above findings. IMPRESSION: 1.  Negative for pulmonary embolism.  No arterial abnormality. 2. Small somewhat clustered pulmonary nodules in the left lower lobe, likely inflammatory/infectious. Electronically Signed   By: Tiburcio Pea M.D.   On: 07/27/2023 06:27    Procedures Procedures    Medications Ordered in ED Medications - No data to display  ED Course/ Medical Decision Making/ A&P                                 Medical Decision Making Amount and/or Complexity of Data Reviewed Labs: ordered. Decision-making details documented in ED Course. Radiology: ordered and independent interpretation performed. Decision-making details documented in ED Course. ECG/medicine tests: ordered and independent interpretation performed. Decision-making details documented in ED Course.  Risk Prescription drug management.   Right arm coolness, swelling, pain 4 days after right axillary surgery.  Vitals are stable.  Pulse diminished but present with Doppler.  Concern for possible vascular abnormality or DVT.  Ultrasound not available.  Discussed with Dr. Christin Fudge of plastic surgery covering for Dr. Senaida Ores.  He agrees with CTA.  Will call him back if vascular abnormality.  Would benefit from venous Doppler which can be done as an outpatient in their office or here tomorrow.  CT PE study is negative for pulmonary embolism.  Does show nodularity to left lower lobe. CTA of right upper extremity is nondiagnostic for wrist or hand blood vessels.  No blockages seen in upper or forearm.  As it is now 7 AM, patient agreeable to wait for Doppler ultrasound of arm rather than come back later. Will rule out DVT.  Also will obtain arterial Doppler given difficulty visualizing distal forearm and hand vasculature and CTA.  She need follow-up with her surgeon at [facility].  Care transferred at shift change to Dr. Renaye Rakers.        Final Clinical Impression(s) / ED Diagnoses Final diagnoses:  Redness and swelling of upper arm     Rx / DC Orders ED Discharge Orders     None         Trenity Pha, Jeannett Senior, MD 07/27/23 [id number]    Glynn Octave, MD 07/27/23 [phone]
